# Patient Record
Sex: Female | Born: 1995 | Race: Black or African American | Hispanic: No | Marital: Single | Smoking: Never smoker
Health system: Southern US, Community
[De-identification: ages and names within clinical notes are randomized; demographics above are authoritative.]

## PROBLEM LIST (undated history)

## (undated) HISTORY — PX: TONSILLECTOMY: SUR1361

## (undated) HISTORY — PX: OTHER SURGICAL HISTORY: SHX169

---

## 2017-12-02 ENCOUNTER — Emergency Department (HOSPITAL_COMMUNITY)
Admission: EM | Admit: 2017-12-02 | Discharge: 2017-12-02 | Disposition: A | Discharge status: Home / Self Care | Payer: 59 | Attending: Emergency Medicine | Admitting: Emergency Medicine

## 2017-12-02 ENCOUNTER — Emergency Department (HOSPITAL_COMMUNITY): Payer: 59

## 2017-12-02 ENCOUNTER — Encounter (HOSPITAL_COMMUNITY): Payer: Self-pay | Admitting: Emergency Medicine

## 2017-12-02 DIAGNOSIS — J069 Acute upper respiratory infection, unspecified: Secondary | ICD-10-CM | POA: Diagnosis not present

## 2017-12-02 DIAGNOSIS — R05 Cough: Secondary | ICD-10-CM | POA: Diagnosis present

## 2017-12-02 DIAGNOSIS — R197 Diarrhea, unspecified: Secondary | ICD-10-CM | POA: Insufficient documentation

## 2017-12-02 LAB — COMPREHENSIVE METABOLIC PANEL
ALK PHOS: 89 U/L (ref 38–126)
ALT: 21 U/L (ref 14–54)
ANION GAP: 5 (ref 5–15)
AST: 18 U/L (ref 15–41)
Albumin: 4.3 g/dL (ref 3.5–5.0)
BUN: 10 mg/dL (ref 6–20)
CALCIUM: 9.7 mg/dL (ref 8.9–10.3)
CO2: 28 mmol/L (ref 22–32)
CREATININE: 0.78 mg/dL (ref 0.44–1.00)
Chloride: 107 mmol/L (ref 101–111)
GFR calc non Af Amer: >60 mL/min (ref >60)
GLUCOSE: 107 mg/dL — AB (ref 65–99)
Potassium: 3.7 mmol/L (ref 3.5–5.1)
Sodium: 140 mmol/L (ref 135–145)
TOTAL PROTEIN: 7.8 g/dL (ref 6.5–8.1)
Total Bilirubin: 0.7 mg/dL (ref 0.3–1.2)

## 2017-12-02 LAB — CBC
HCT: 39.3 % (ref 36.0–46.0)
HEMOGLOBIN: 12.8 g/dL (ref 12.0–15.0)
MCH: 30.1 pg (ref 26.0–34.0)
MCHC: 32.6 g/dL (ref 30.0–36.0)
MCV: 92.5 fL (ref 78.0–100.0)
PLATELETS: 293 10*3/uL (ref 150–400)
RBC: 4.25 MIL/uL (ref 3.87–5.11)
RDW: 11.9 % (ref 11.5–15.5)
WBC: 8.9 10*3/uL (ref 4.0–10.5)

## 2017-12-02 LAB — LIPASE, BLOOD: Lipase: 21 U/L (ref 11–51)

## 2017-12-02 LAB — I-STAT BETA HCG BLOOD, ED (MC, WL, AP ONLY)

## 2017-12-02 MED ORDER — GUAIFENESIN ER 600 MG PO TB12: 600.0000 mg | ORAL_TABLET | Freq: Two times a day (BID) | ORAL | 0 refills | Status: AC

## 2017-12-02 MED ORDER — PSEUDOEPHEDRINE HCL ER 120 MG PO TB12: 120.0000 mg | ORAL_TABLET | Freq: Two times a day (BID) | ORAL | 0 refills | Status: AC

## 2017-12-02 NOTE — ED Notes (Signed)
Returned from xray

## 2017-12-02 NOTE — ED Provider Notes (Signed)
[facility]-EMERGENCY DEPT Provider Note   CSN: [id number] Arrival date & time: 12/02/17  1503     History   Chief Complaint Chief Complaint  Patient presents with  . Diarrhea  . Sore Throat    HPI Alison Combs is a 22 y.o. female.  HPI Patient presents to the emergency room for evaluation of cough congestion and sore throat.  Patient states her symptoms started about a week ago.  She started initially with a sore throat that seemed to get better.  She is continued with postnasal drainage and coughing.  Feels like she has mucus in her chest that she cannot bring up.  She had a couple episodes of diarrhea today but no nausea or vomiting.  No dysuria. History reviewed. No pertinent past medical history.  There are no active problems to display for this patient.   Past Surgical History:  Procedure Laterality Date  . TONSILLECTOMY    . tubes in ears      OB History    No data available       Home Medications    Prior to Admission medications   Medication Sig Start Date End Date Taking? Authorizing Provider  guaiFENesin (MUCINEX) 600 MG 12 hr tablet Take 1 tablet (600 mg total) by mouth 2 (two) times daily. 12/02/17   Linwood Dibbles, MD  pseudoephedrine (SUDAFED 12 HOUR) 120 MG 12 hr tablet Take 1 tablet (120 mg total) by mouth every 12 (twelve) hours. 12/02/17   Linwood Dibbles, MD    Family History No family history on file.  Social History Social History   Tobacco Use  . Smoking status: Never Smoker  . Smokeless tobacco: Never Used  Substance Use Topics  . Alcohol use: No    Frequency: Never  . Drug use: Not on file     Allergies   Augmentin [amoxicillin-pot clavulanate]   Review of Systems Review of Systems  All other systems reviewed and are negative.    Physical Exam Updated Vital Signs BP (!) 147/100 (BP Location: Right Arm)   Pulse 90   Temp 99.8 F (37.7 C) (Oral)   Resp 18   LMP 11/13/2017   SpO2 100%   Physical Exam    Constitutional: She appears well-developed and well-nourished. No distress.  HENT:  Head: Normocephalic and atraumatic.  Right Ear: Tympanic membrane and external ear normal. No swelling.  Left Ear: Tympanic membrane and external ear normal. No swelling.  Mouth/Throat: Uvula is midline, oropharynx is clear and moist and mucous membranes are normal. No oral lesions. No uvula swelling. No tonsillar exudate.  Eyes: Conjunctivae are normal. Right eye exhibits no discharge. Left eye exhibits no discharge. No scleral icterus.  Neck: Neck supple. No tracheal deviation present.  Cardiovascular: Normal rate, regular rhythm and intact distal pulses.  Pulmonary/Chest: Effort normal and breath sounds normal. No stridor. No respiratory distress. She has no wheezes. She has no rales.  Abdominal: Soft. Bowel sounds are normal. She exhibits no distension. There is no tenderness. There is no rebound and no guarding.  Musculoskeletal: She exhibits no edema or tenderness.  Neurological: She is alert. She has normal strength. No cranial nerve deficit (no facial droop, extraocular movements intact, no slurred speech) or sensory deficit. She exhibits normal muscle tone. She displays no seizure activity. Coordination normal.  Skin: Skin is warm and dry. No rash noted.  Psychiatric: She has a normal mood and affect.  Nursing note and vitals reviewed.    ED Treatments /  Results  Labs (all labs ordered are listed, but only abnormal results are displayed) Labs Reviewed  COMPREHENSIVE METABOLIC PANEL - Abnormal; Notable for the following components:      Result Value   Glucose, Bld 107 (*)    All other components within normal limits  LIPASE, BLOOD  CBC  I-STAT BETA HCG BLOOD, ED ([facility], [facility], [facility] ONLY)     Radiology Dg Chest 2 View  Result Date: 12/02/2017 CLINICAL DATA:  Cough and congestion for 1 week EXAM: CHEST  2 VIEW COMPARISON:  None. FINDINGS: The heart size and mediastinal contours are within normal  limits. Both lungs are clear. The visualized skeletal structures are unremarkable. IMPRESSION: No active cardiopulmonary disease. Electronically Signed   By: Alcide CleverMark  Lukens M.D.   On: 12/02/2017 21:30    Procedures Procedures (including critical care time)  Medications Ordered in ED Medications - No data to display   Initial Impression / Assessment and Plan / ED Course  I have reviewed the triage vital signs and the nursing notes.  Pertinent labs & imaging results that were available during my care of the patient were reviewed by me and considered in my medical decision making (see chart for details).    Symptoms are consistent with a virale upper respiratory infection. There is no evidence to suggest pneumonia . Marland Kitchen. I discussed supportive treatment. I encouraged followup with the primary care doctor next week if symptoms have not resolved. Warning signs and reasons to return to the emergency room were discussed    Final Clinical Impressions(s) / ED Diagnoses   Final diagnoses:  Upper respiratory tract infection, unspecified type    ED Discharge Orders        Ordered    guaiFENesin (MUCINEX) 600 MG 12 hr tablet  2 times daily     12/02/17 2156    pseudoephedrine (SUDAFED 12 HOUR) 120 MG 12 hr tablet  Every 12 hours     12/02/17 2156       Linwood DibblesKnapp, Atavia Poppe, MD 12/02/17 2158

## 2017-12-02 NOTE — ED Notes (Signed)
ED Provider at bedside. 

## 2017-12-02 NOTE — Discharge Instructions (Signed)
Take the medications as prescribed.  Make sure to drink plenty of fluids.  Follow-up with a primary care doctor if not better in a week or so

## 2017-12-02 NOTE — ED Triage Notes (Signed)
patient c/o sore throat and cough that is non productive since last week. Today started having diarrhea x 2 with burning sensation in abd before has BM.

## 2018-11-04 ENCOUNTER — Emergency Department (HOSPITAL_COMMUNITY)
Admission: EM | Admit: 2018-11-04 | Discharge: 2018-11-04 | Disposition: A | Discharge status: Home / Self Care | Payer: 59 | Attending: Emergency Medicine | Admitting: Emergency Medicine

## 2018-11-04 ENCOUNTER — Other Ambulatory Visit: Payer: Self-pay

## 2018-11-04 ENCOUNTER — Encounter (HOSPITAL_COMMUNITY): Payer: Self-pay

## 2018-11-04 ENCOUNTER — Emergency Department (HOSPITAL_COMMUNITY): Payer: 59

## 2018-11-04 DIAGNOSIS — R590 Localized enlarged lymph nodes: Secondary | ICD-10-CM | POA: Insufficient documentation

## 2018-11-04 DIAGNOSIS — Z79899 Other long term (current) drug therapy: Secondary | ICD-10-CM | POA: Insufficient documentation

## 2018-11-04 LAB — URINALYSIS, ROUTINE W REFLEX MICROSCOPIC
Bilirubin Urine: NEGATIVE
Glucose, UA: NEGATIVE mg/dL
Hgb urine dipstick: NEGATIVE
Ketones, ur: NEGATIVE mg/dL
Leukocytes, UA: NEGATIVE
NITRITE: NEGATIVE
Protein, ur: NEGATIVE mg/dL
SPECIFIC GRAVITY, URINE: 1.021 (ref 1.005–1.030)
pH: 7 (ref 5.0–8.0)

## 2018-11-04 LAB — CBC WITH DIFFERENTIAL/PLATELET
Abs Immature Granulocytes: 0.01 10*3/uL (ref 0.00–0.07)
BASOS ABS: 0 10*3/uL (ref 0.0–0.1)
BASOS PCT: 0 %
EOS ABS: 0.3 10*3/uL (ref 0.0–0.5)
EOS PCT: 5 %
HCT: 42.2 % (ref 36.0–46.0)
Hemoglobin: 13.2 g/dL (ref 12.0–15.0)
Immature Granulocytes: 0 %
Lymphocytes Relative: 28 %
Lymphs Abs: 1.9 10*3/uL (ref 0.7–4.0)
MCH: 29.1 pg (ref 26.0–34.0)
MCHC: 31.3 g/dL (ref 30.0–36.0)
MCV: 93 fL (ref 80.0–100.0)
Monocytes Absolute: 0.6 10*3/uL (ref 0.1–1.0)
Monocytes Relative: 8 %
NEUTROS ABS: 4 10*3/uL (ref 1.7–7.7)
NEUTROS PCT: 59 %
PLATELETS: 342 10*3/uL (ref 150–400)
RBC: 4.54 MIL/uL (ref 3.87–5.11)
RDW: 11.9 % (ref 11.5–15.5)
WBC: 6.8 10*3/uL (ref 4.0–10.5)
nRBC: 0 % (ref 0.0–0.2)

## 2018-11-04 LAB — MONONUCLEOSIS SCREEN: MONO SCREEN: NEGATIVE

## 2018-11-04 LAB — POC URINE PREG, ED: PREG TEST UR: NEGATIVE

## 2018-11-04 NOTE — ED Triage Notes (Signed)
Patient reports that she went [place] an [place] in [place] for [place]/o headache, and swollen lymph glands. Patient states she was given antibiotics and steroids.  Today, the patient is complaining of the same and worse than what it was previously.

## 2018-11-04 NOTE — ED Provider Notes (Signed)
[facility]-EMERGENCY DEPT Provider Note   CSN: [id number] Arrival date & time: 11/04/18  1514     History   Chief Complaint Chief Complaint  Patient presents with  . Lymphadenopathy  . Nausea    HPI Alison Combs is a 22 y.o. female.  HPI 22 year old female, otherwise healthy, presents with tender cervical lymphadenopathy.  Patient states she was seen and evaluated at another ED for this on 11/27.  She was started on clindamycin.  She states since then she has had improvement of the lymph node in the occipital region and worsening of the lymph nodes on the right side of her neck.  She denies any difficulty swallowing, difficulty breathing.  She denies any redness, fevers, chills, sore throat.    History reviewed. No pertinent past medical history.  There are no active problems to display for this patient.   Past Surgical History:  Procedure Laterality Date  . TONSILLECTOMY    . tubes in ears       OB History   None      Home Medications    Prior to Admission medications   Medication Sig Start Date End Date Taking? Authorizing Provider  guaiFENesin (MUCINEX) 600 MG 12 hr tablet Take 1 tablet (600 mg total) by mouth 2 (two) times daily. 12/02/17   Linwood Dibbles, MD  pseudoephedrine (SUDAFED 12 HOUR) 120 MG 12 hr tablet Take 1 tablet (120 mg total) by mouth every 12 (twelve) hours. 12/02/17   Linwood Dibbles, MD    Family History Family History  Problem Relation Age of Onset  . Lupus Mother   . Hypertension Mother   . Hypertension Father     Social History Social History   Tobacco Use  . Smoking status: Never Smoker  . Smokeless tobacco: Never Used  Substance Use Topics  . Alcohol use: No    Frequency: Never  . Drug use: Never     Allergies   Bactrim [sulfamethoxazole-trimethoprim] and Augmentin [amoxicillin-pot clavulanate]   Review of Systems Review of Systems  Constitutional: Negative for chills and fever.  HENT: Negative for ear  pain, facial swelling, rhinorrhea, sinus pain and sore throat.   Eyes: Negative for redness.  Respiratory: Negative for shortness of breath.   Cardiovascular: Negative for chest pain.  Gastrointestinal: Negative for abdominal pain, nausea and vomiting.  Genitourinary: Negative for dysuria and hematuria.  Musculoskeletal: Positive for neck pain. Negative for back pain and neck stiffness.     Physical Exam Updated Vital Signs BP (!) 147/81 (BP Location: Right Arm)   Pulse 81   Temp 98.4 F (36.9 C) (Oral)   Resp 18   Ht 5\' 6"  (1.676 m)   Wt 72.6 kg   LMP 10/24/2018   SpO2 100%   BMI 25.82 kg/m   Physical Exam  Constitutional: She is oriented to person, place, and time. She appears well-developed and well-nourished.  HENT:  Head: Normocephalic and atraumatic.  Right Ear: Tympanic membrane normal.  Left Ear: Tympanic membrane normal.  Nose: Nose normal.  Mouth/Throat: Uvula is midline, oropharynx is clear and moist and mucous membranes are normal.  Eyes: Conjunctivae and EOM are normal.  Neck: Normal range of motion. Neck supple. No neck rigidity. No edema and no erythema present.    Cardiovascular: Normal rate, regular rhythm and normal heart sounds.  No murmur heard. Pulmonary/Chest: Effort normal and breath sounds normal. No respiratory distress. She has no wheezes. She has no rales.  Abdominal: Soft. Bowel sounds are normal.  She exhibits no distension. There is no tenderness.  Musculoskeletal: Normal range of motion. She exhibits no tenderness or deformity.  Neurological: She is alert and oriented to person, place, and time.  Skin: Skin is warm and dry. No rash noted. No erythema.  Psychiatric: She has a normal mood and affect. Her behavior is normal.  Nursing note and vitals reviewed.    ED Treatments / Results  Labs (all labs ordered are listed, but only abnormal results are displayed) Labs Reviewed  URINALYSIS, ROUTINE W REFLEX MICROSCOPIC - Abnormal; Notable  for the following components:      Result Value   APPearance CLOUDY (*)    All other components within normal limits  CBC WITH DIFFERENTIAL/PLATELET  MONONUCLEOSIS SCREEN  POC URINE PREG, ED    EKG None  Radiology Dg Chest 2 View  Result Date: 11/04/2018 CLINICAL DATA:  Headache and swollen lymph glands. EXAM: CHEST - 2 VIEW COMPARISON:  Chest x-ray 12/02/2017. FINDINGS: Mediastinum and hilar structures normal. Lungs are clear. No pleural effusion or pneumothorax. Heart size normal. No acute bony abnormality identified. IMPRESSION: No acute cardiopulmonary disease. Electronically Signed   By: Maisie Fushomas  Register   On: 11/04/2018 17:31    Procedures Procedures (including critical care time)  Medications Ordered in ED Medications - No data to display   Initial Impression / Assessment and Plan / ED Course  I have reviewed the triage vital signs and the nursing notes.  Pertinent labs & imaging results that were available during my care of the patient were reviewed by me and considered in my medical decision making (see chart for details).     Patient resting comfortably in bed, no acute distress, nontoxic, non-lethargic.  Vital signs stable.  Patient remains very well-appearing.  Patient is currently being treated for cervical lymphadenitis with clindamycin.  She states him times have persisted and even worsened slightly despite being on clindamycin.  She denies any infectious-like symptoms.  CBC unremarkable, mono negative, rest x-ray clear.  Urine shows no UTI.  Encouraged patient to finish her clindamycin.  Unclear etiology of her tender cervical lymphadenitis.  Encourage close follow-up with primary care.  Given strict return precautions.  Patient expresses understanding, is ready and stable for discharge.   At this time there does not appear to be any evidence of an acute emergency medical condition and the patient appears stable for discharge with appropriate outpatient follow  up.Diagnosis was discussed with patient who verbalizes understanding and is agreeable to discharge.   Final Clinical Impressions(s) / ED Diagnoses   Final diagnoses:  None    ED Discharge Orders    None       Rueben BashKendrick, Chason Mciver S, PA-C 11/04/18 2228    Loren RacerYelverton, David, MD 11/04/18 2334

## 2018-11-04 NOTE — Discharge Instructions (Signed)
Continue your clindamycin as previously prescribed.  Follow-up with your primary care provider on Monday for continued evaluation.  Your blood work was reassuring, your mono was negative and your chest x-ray showed no pneumonia.  Please return to the ED immediately for new or worsening symptoms, such as fevers, chills, vomiting, abdominal pain, chest pain, shortness of breath or any concerns at all.

## 2019-08-21 IMAGING — CR DG CHEST 2V
2 series · 2 of 2 positions shown · non-contrast
Comparison: Chest x-ray 12/02/2017.

CLINICAL DATA: Headache and swollen lymph glands.

EXAM:
CHEST - 2 VIEW

[w chest pa]
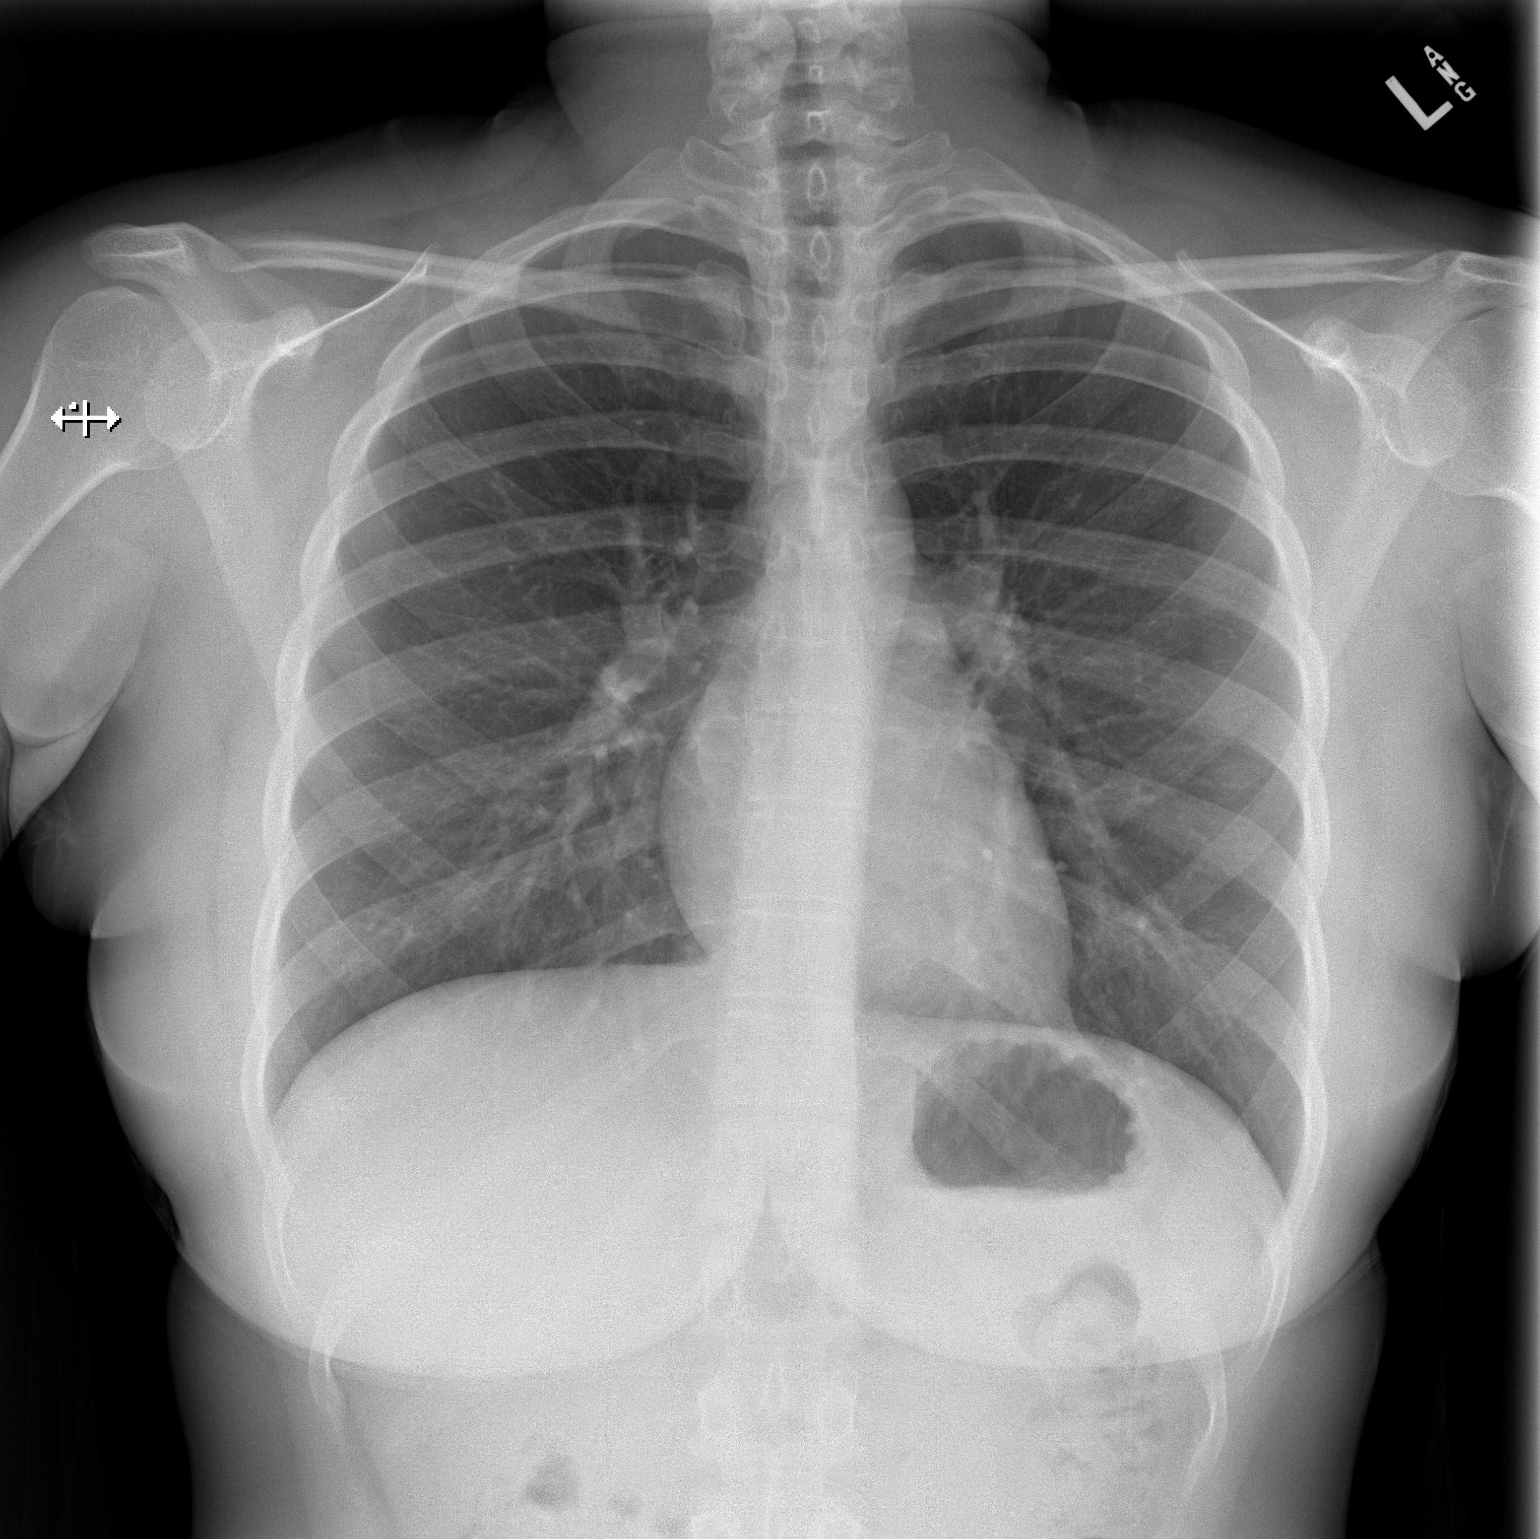

[w chest lat]
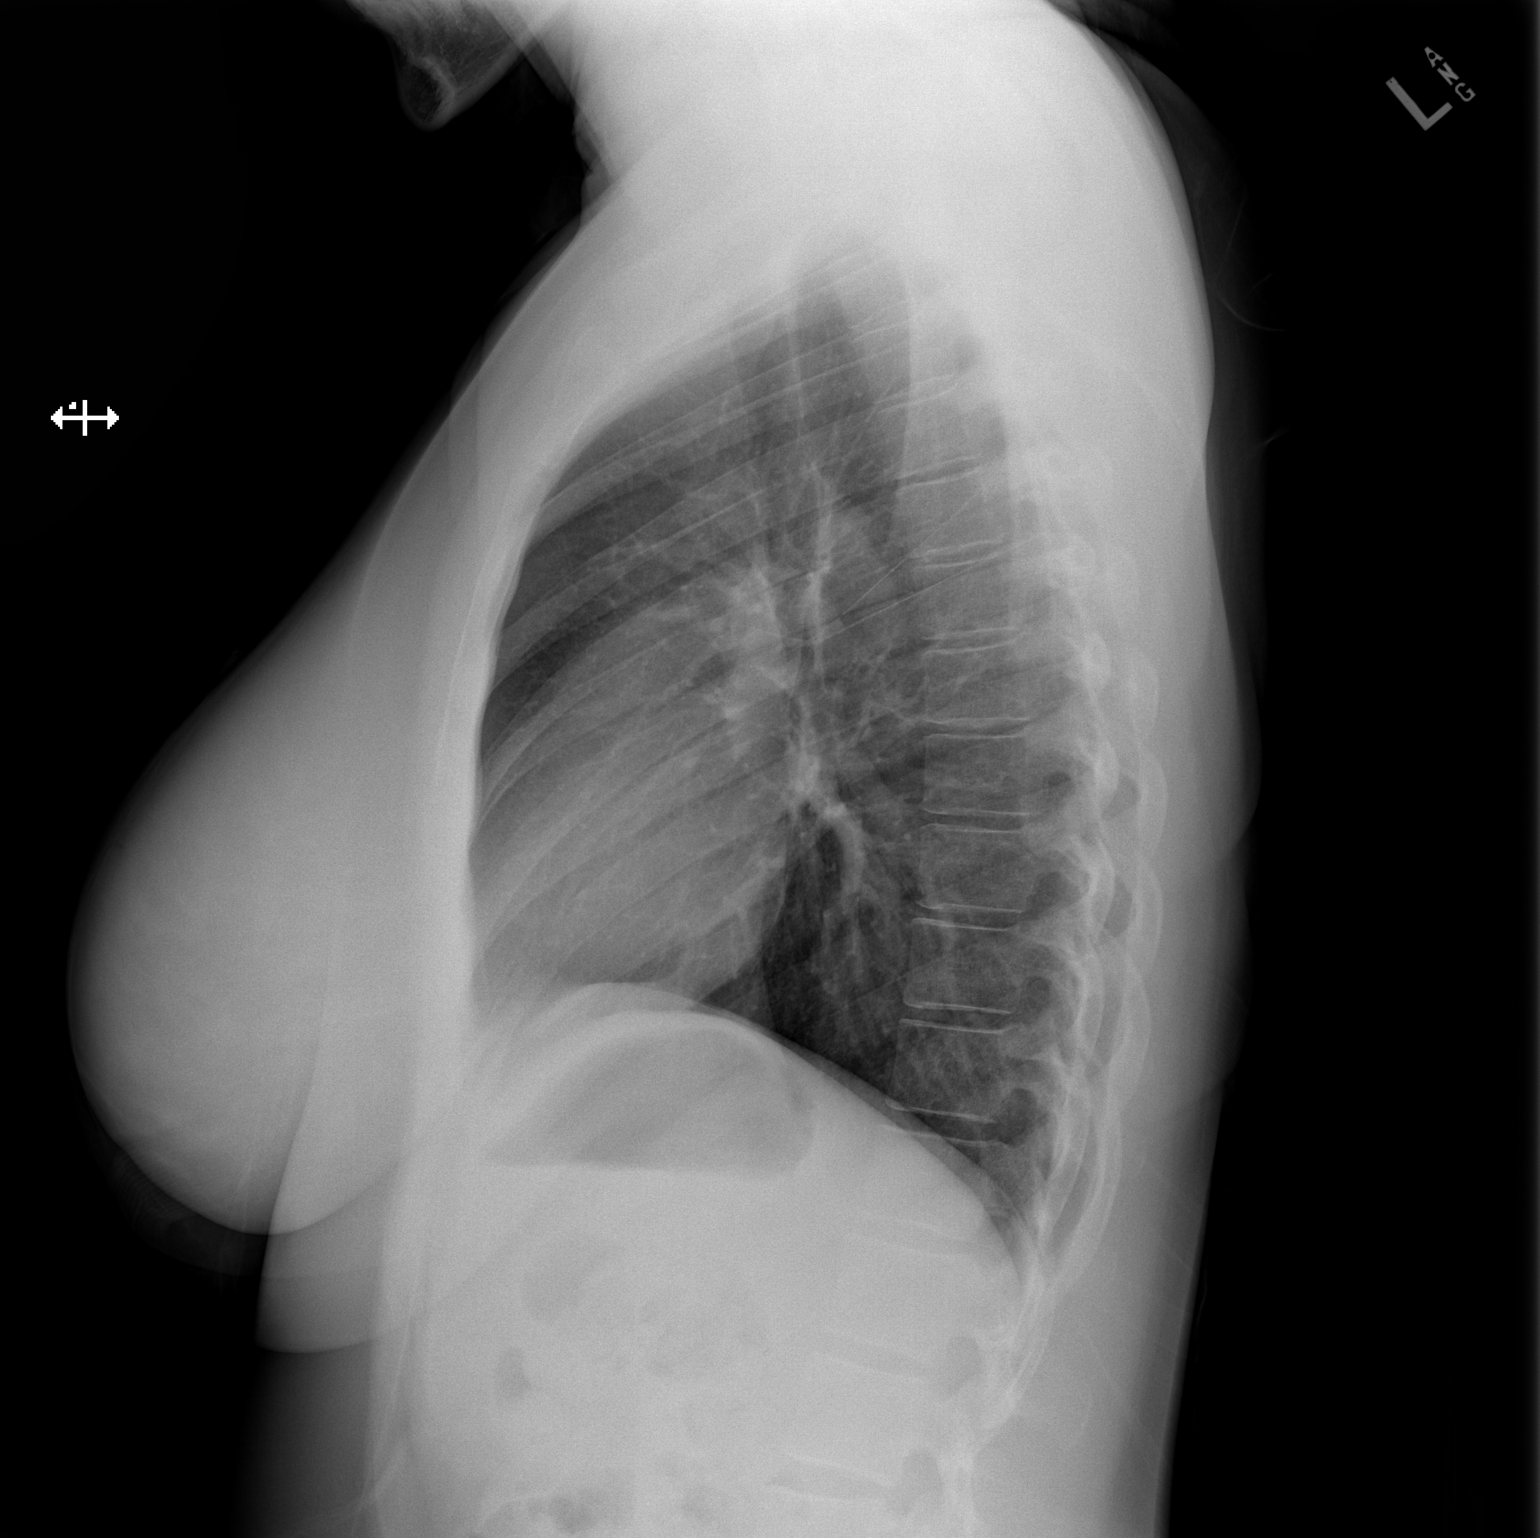

[2 of 2 positions shown; findings below may reference images not displayed]

FINDINGS: Mediastinum and hilar structures normal. Lungs are clear. No pleural
effusion or pneumothorax. Heart size normal. No acute bony
abnormality identified.
IMPRESSION: No acute cardiopulmonary disease.

## 2019-11-17 ENCOUNTER — Other Ambulatory Visit: Payer: Self-pay

## 2019-11-17 DIAGNOSIS — Z20822 Contact with and (suspected) exposure to covid-19: Secondary | ICD-10-CM

## 2019-11-19 LAB — NOVEL CORONAVIRUS, NAA: SARS-CoV-2, NAA: NOT DETECTED
